# Patient Record
Sex: Male | Born: 1985 | Race: White | Hispanic: No | Marital: Married | Smoking: Never smoker
Health system: Southern US, Community
[De-identification: ages and names within clinical notes are randomized; demographics above are authoritative.]

## PROBLEM LIST (undated history)

## (undated) DIAGNOSIS — I10 Essential (primary) hypertension: Secondary | ICD-10-CM

## (undated) HISTORY — PX: TONSILLECTOMY: SUR1361

---

## 2016-05-31 ENCOUNTER — Ambulatory Visit: Payer: 59 | Attending: Family Medicine | Admitting: Physical Therapy

## 2016-05-31 DIAGNOSIS — M6281 Muscle weakness (generalized): Secondary | ICD-10-CM | POA: Insufficient documentation

## 2016-05-31 DIAGNOSIS — R293 Abnormal posture: Secondary | ICD-10-CM

## 2016-05-31 DIAGNOSIS — M5442 Lumbago with sciatica, left side: Secondary | ICD-10-CM | POA: Insufficient documentation

## 2016-05-31 NOTE — Therapy (Signed)
[facility] [address], [address] [place], [place], [address] Phone: [phone]   Fax:  [phone]  Physical Therapy Evaluation  Patient Details  Name: Willie Kent MRN: [id number] Date of Birth: May 15, 1986 Referring Provider: Farris Has  Encounter Date: 05/31/2016      PT End of Session - 05/31/16 2004    Visit Number 1   Date for PT Re-Evaluation 07/26/16   Authorization Type UHC   PT Start Time 0930   PT Stop Time 1015   PT Time Calculation (min) 45 min   Activity Tolerance Patient tolerated treatment well      No past medical history on file.  No past surgical history on file.  There were no vitals filed for this visit.       Subjective Assessment - 05/31/16 0938    Subjective Works at [employer] and was pulling a pallet jack to pull a pallet of water off (6 months ago).  Took muscle relaxers 30 days and was feeling better.  3 weeks ago was pushing 3 carts into store and it hurts worse than it did originally.  This time left > right LBP with left > right post/thigh pain to knee.   Pertinent History PCN allergy; takes med for HTN   Limitations House hold activities;Lifting;Walking   How long can you sit comfortably? with a back on chair I can sit an indefinite amt out ime   How long can you walk comfortably? 2 hours   Diagnostic tests none yet   Patient Stated Goals I want to strengthen and prevent from happening in the future;  not take meds   Currently in Pain? Yes   Pain Score 1    Pain Location Back   Pain Orientation Left   Pain Type Acute pain   Aggravating Factors  bending; sit without support like on the floor with 9 month old baby;     Pain Relieving Factors lying down especially on back with knees bent            [facility] PT Assessment - 05/31/16 0001      Assessment   Medical Diagnosis low back pain   Referring Provider Farris Has   Onset Date/Surgical Date --  3 weeks ago   Hand Dominance  Right   Next MD Visit not scheduled  if not better in 3 weeks will get MRI   Prior Therapy knee PT in high school     Precautions   Precautions None     Restrictions   Weight Bearing Restrictions No     Balance Screen   Has the patient fallen in the past 6 months No   Has the patient had a decrease in activity level because of a fear of falling?  No   Is the patient reluctant to leave their home because of a fear of falling?  No     Home Environment   Living Environment Private residence   Type of Home House     Prior Function   Level of Independence Independent   Vocation Full time employment   Leisure 4 yr. and 79 yr old and infant;  [employer]     Observation/Other Assessments   Focus on Therapeutic Outcomes (FOTO)  43% limitation      Posture/Postural Control   Posture/Postural Control Postural limitations   Postural Limitations Decreased lumbar lordosis     AROM   AROM Assessment Site Lumbar   Lumbar Flexion 25  Lumbar Extension 20   Lumbar - Right Side Bend 43   Lumbar - Left Side Bend 30     Strength   Strength Assessment Site Lumbar   Lumbar Flexion 4-/5   Lumbar Extension 4-/5     Palpation   Palpation comment No muscular tenderness     Slump test   Findings Positive   Side Left   Comment Also painful on left with right slump     Prone Knee Bend Test   Findings Negative     Straight Leg Raise   Findings Positive   Side  Left                           PT Education - 05/31/16 2004    Education provided Yes   Education Details trial of prone press ups or standing extensions;  flexion avoidance   Person(s) Educated Patient   Methods Explanation;Demonstration;Handout   Comprehension Verbalized understanding;Returned demonstration          PT Short Term Goals - 05/31/16 2016      PT SHORT TERM GOAL #1   Title The patient will demonstrate basic understanding of basic self management techniques including use of  lumbar roll, body mechanics at home/work to promote healing  06/28/16   Time 4   Period Weeks   Status New     PT SHORT TERM GOAL #2   Title Patient will report a 30% improvement in back pain and centralization of symptoms   Time 4   Period Weeks   Status New     PT SHORT TERM GOAL #3   Title Patient will have improved lumbar flexion to 40 degrees, extension to 25 and left sidbending to 38 degrees needed for home and work mobility   Time 4   Period Weeks   Status New           PT Long Term Goals - 05/31/16 2020      PT LONG TERM GOAL #1   Title The patient will be independent in safe self progression of HEP for further improvements in ROM, strength and pain and to decrease chance of recurrence   07/26/16   Time 8   Period Weeks   Status New     PT LONG TERM GOAL #2   Title The patient will report a 60% improvement in pain and centralization of symptoms with home and work ADLs   Time 8   Period Weeks   Status New     PT LONG TERM GOAL #3   Title The patient will have improved core/trunk strength to 4/5 needed for lifting, pushing/pulling at work   Time 8   Period Weeks   Status New     PT LONG TERM GOAL #4   Title FOTO functional outcome score improved from 43% limitation to 28% limitation indicating improved function with less pain   Time 8   Period Weeks   Status New               Plan - 05/31/16 2005    Clinical Impression Statement The patient reports that 6 months ago, while at work, he injured his back while moving a pallet of water.  His symptoms improved with muscle relaxers.  Three weeks ago he was merely pushing 3 shopping carts when he had a sudden return of back pain with radiating pain down the posterior-lateral left thigh to the knee.  Pain is  worsened with lifting and bending and sitting on the floor without back support.  He notes some pain with putting his 46 month old baby into the crib.  Decreased lumbar lordosis.  Decreased lumbar AROM:  flex  25, extension 20, right sidebend 43, left sidebend30 degrees.   Possible extension preference.   Decreased trunk/core strength 4-/5.  Positive left slump test as well as cross symptoms with right slump test.  Positive left SLR.   Normal LE strength and sensation.   The patient would benefit from PT to address these deficits.  The patient is of low complexity evaluation.     Rehab Potential Good   PT Frequency 2x / week   PT Duration 8 weeks   PT Treatment/Interventions ADLs/Self Care Home Management;Electrical Stimulation;Cryotherapy;Moist Heat;Traction;Ultrasound;Therapeutic exercise;Patient/family education;Manual techniques;Dry needling;Taping   PT Next Visit Plan assess response to trial of lumbar extensions to centralize symptoms and progress with overpressure;  PA lumbar mobs; review body mechanics;  abdominal brace;  e-stim/heat as needed for pain      Patient will benefit from skilled therapeutic intervention in order to improve the following deficits and impairments:  Decreased range of motion, Decreased strength, Pain, Postural dysfunction  Visit Diagnosis: Left-sided low back pain with left-sided sciatica - Plan: PT plan of care cert/re-cert  Muscle weakness (generalized) - Plan: PT plan of care cert/re-cert  Abnormal posture - Plan: PT plan of care cert/re-cert     Problem List There are no active problems to display for this patient.  Lavinia Sharps, PT 05/31/16 8:28 PM Phone: [phone] Fax: [phone]  Vivien Presto 05/31/2016, 8:27 PM  [facility] [address], [address] [place], [place], [address] Phone: [phone]   Fax:  [phone]  Name: Willie Kent MRN: [id number] Date of Birth: 1986/07/16

## 2016-05-31 NOTE — Patient Instructions (Signed)
Willie Kent PT [facility] [address], [address] [place], [place] [address] Phone # [phone] Fax [phone]    

## 2016-06-07 ENCOUNTER — Encounter: Payer: 59 | Admitting: Physical Therapy

## 2016-06-09 ENCOUNTER — Encounter: Payer: Self-pay | Admitting: Physical Therapy

## 2016-06-09 ENCOUNTER — Ambulatory Visit: Payer: 59 | Admitting: Physical Therapy

## 2016-06-09 DIAGNOSIS — M6281 Muscle weakness (generalized): Secondary | ICD-10-CM

## 2016-06-09 DIAGNOSIS — M5442 Lumbago with sciatica, left side: Secondary | ICD-10-CM | POA: Diagnosis not present

## 2016-06-09 DIAGNOSIS — R293 Abnormal posture: Secondary | ICD-10-CM

## 2016-06-09 NOTE — Therapy (Signed)
[facility] [address], [address] [place], [place], [address] Phone: [phone]   Fax:  [phone]  Physical Therapy Treatment  Patient Details  Name: Willie Kent MRN: [id number] Date of Birth: 10/27/1985 Referring Provider: Farris HasAaron Morrow  Encounter Date: 06/09/2016      PT End of Session - 06/09/16 1050    Visit Number 2   Date for PT Re-Evaluation 07/26/16   Authorization Type UHC   PT Start Time 1011   PT Stop Time 1058   PT Time Calculation (min) 47 min   Activity Tolerance Patient tolerated treatment well   Behavior [facility] Therapy [facility] for tasks assessed/performed      History reviewed. No pertinent past medical history.  History reviewed. No pertinent surgical history.  There were no vitals filed for this visit.      Subjective Assessment - 06/09/16 1014    Subjective Pt reports back has been doing better over all but over did it yesterday so having increase pain today. Has been doing home exercises and repotrs those are helping.    Pertinent History PCN allergy; takes med for HTN   Limitations House hold activities;Lifting;Walking   How long can you sit comfortably? with a back on chair I can sit an indefinite amt out ime   How long can you walk comfortably? 2 hours   Diagnostic tests none yet   Patient Stated Goals I want to strengthen and prevent from happening in the future;  not take meds   Currently in Pain? Yes   Pain Score 2    Pain Location Back   Pain Orientation Left   Pain Descriptors / Indicators Sharp;Aching   Pain Type Acute pain   Pain Onset More than a month ago   Pain Frequency Intermittent   Aggravating Factors  Bending prolonged standing walking   Pain Relieving Factors Rest and Naproxin   Multiple Pain Sites No                         [facility] Adult PT Treatment/Exercise - 06/09/16 0001      Exercises   Exercises Lumbar;Knee/Hip     Lumbar Exercises: Stretches   Prone on Elbows Stretch 2 reps;10 seconds  Modified for standing and sitting     Lumbar Exercises: Aerobic   Stationary Bike 8 mins L4     Lumbar Exercises: Standing   Other Standing Lumbar Exercises 3 direction leg kicks     Knee/Hip Exercises: Stretches   Theme park managerGastroc Stretch Both;2 reps     Knee/Hip Exercises: Standing   Other Standing Knee Exercises --   Other Standing Knee Exercises Standing hamstring curls #3     Knee/Hip Exercises: Seated   Ball Squeeze 20   Clamshell with TheraBand Red   Sit to Sand 3 sets;10 reps;without UE support     Modalities   Modalities Electrical Stimulation  to low back with ice                  PT Short Term Goals - 06/09/16 1046      PT SHORT TERM GOAL #1   Title The patient will demonstrate basic understanding of basic self management techniques including use of lumbar roll, body mechanics at home/work to promote healing  06/28/16   Time 4   Period Weeks   Status On-going     PT SHORT TERM GOAL #2   Title Patient will report a 30% improvement in back  pain and centralization of symptoms   Time 4   Period Weeks   Status On-going     PT SHORT TERM GOAL #3   Title Patient will have improved lumbar flexion to 40 degrees, extension to 25 and left sidbending to 38 degrees needed for home and work mobility   Time 4   Period Weeks   Status On-going           PT Long Term Goals - 06/09/16 1049      PT LONG TERM GOAL #1   Title The patient will be independent in safe self progression of HEP for further improvements in ROM, strength and pain and to decrease chance of recurrence   07/26/16   Time 8   Period Weeks   Status On-going     PT LONG TERM GOAL #2   Title The patient will report a 60% improvement in pain and centralization of symptoms with home and work ADLs   Time 8   Period Weeks   Status On-going     PT LONG TERM GOAL #3   Title The patient will have improved core/trunk strength to 4/5 needed for lifting,  pushing/pulling at work   Time 8   Period Weeks               Plan - 06/09/16 1051    Clinical Impression Statement Pt reports back feeling better with therapy and stretching at home. Pt educated on importance of proper body mechanics and how to perform squat porperly. Pt able to demonstrate good body mechanics. Will continue to improve strength for posture and body mechnics need for work and home life.    Rehab Potential Good   PT Frequency 2x / week   PT Duration 8 weeks   PT Treatment/Interventions ADLs/Self Care Home Management;Electrical Stimulation;Cryotherapy;Moist Heat;Traction;Ultrasound;Therapeutic exercise;Patient/family education;Manual techniques;Dry needling;Taping   PT Next Visit Plan Continue to practice proper body mechanics and posture.    Consulted and Agree with Plan of Care Patient      Patient will benefit from skilled therapeutic intervention in order to improve the following deficits and impairments:  Decreased range of motion, Decreased strength, Pain, Postural dysfunction  Visit Diagnosis: Left-sided low back pain with left-sided sciatica  Muscle weakness (generalized)  Abnormal posture     Problem List There are no active problems to display for this patient.   Dessa PhiKatherine Lennix Rotundo PTA 06/09/2016, 11:29 AM  [facility] [address], [address] [place], [place], [address] Phone: [phone]   Fax:  [phone]  Name: Willie Kent MRN: [id number] Date of Birth: 02/19/1986

## 2016-06-14 ENCOUNTER — Encounter: Payer: Self-pay | Admitting: Physical Therapy

## 2016-06-14 ENCOUNTER — Ambulatory Visit: Payer: 59 | Admitting: Physical Therapy

## 2016-06-14 DIAGNOSIS — R293 Abnormal posture: Secondary | ICD-10-CM

## 2016-06-14 DIAGNOSIS — M5442 Lumbago with sciatica, left side: Secondary | ICD-10-CM | POA: Diagnosis not present

## 2016-06-14 DIAGNOSIS — M6281 Muscle weakness (generalized): Secondary | ICD-10-CM

## 2016-06-14 NOTE — Therapy (Addendum)
[facility] [address], [place], [place], [address] Phone: [phone]   Fax:  [phone]  Physical Therapy Treatment/Discharge Summary  Patient Details  Name: Willie Kent MRN: [id number] Date of Birth: 01-Jul-1986 Referring Provider: London Pepper  Encounter Date: 06/14/2016      PT End of Session - 06/14/16 0854    Visit Number 3   Date for PT Re-Evaluation 07/26/16   Authorization Type UHC   PT Start Time 0846   PT Stop Time 0924   PT Time Calculation (min) 38 min   Activity Tolerance Patient tolerated treatment well   Behavior During Therapy [facility] for tasks assessed/performed      History reviewed. No pertinent past medical history.  History reviewed. No pertinent surgical history.  There were no vitals filed for this visit.      Subjective Assessment - 06/14/16 0852    Subjective Pt reports back feeling about the same, not better now worse.    Pertinent History PCN allergy; takes med for HTN   Limitations House hold activities;Lifting;Walking   How long can you sit comfortably? with a back on chair I can sit an indefinite amt out ime   How long can you walk comfortably? 2 hours   Diagnostic tests none yet   Patient Stated Goals I want to strengthen and prevent from happening in the future;  not take meds   Currently in Pain? Yes   Pain Score 2    Pain Location Back   Pain Orientation Left   Pain Descriptors / Indicators Aching;Sharp   Pain Type Acute pain   Pain Onset More than a month ago   Pain Frequency Intermittent   Aggravating Factors  Pushing carts at work   Pain Relieving Factors Rest and Naproxin   Multiple Pain Sites No                         [facility] Adult PT Treatment/Exercise - 06/14/16 0001      Lumbar Exercises: Aerobic   Stationary Bike 6 mins L4  While therapist assesed pain     Lumbar Exercises: Standing   Functional Squats 15 reps     Lumbar Exercises:  Supine   Clam 20 reps;2 seconds   Bridge 20 reps;2 seconds   Straight Leg Raise 1 second;20 reps   Other Supine Lumbar Exercises Supine marches 20x2     Knee/Hip Exercises: Stretches   Press photographer Both;2 reps     Knee/Hip Exercises: Standing   Knee Flexion AROM;Strengthening;2 sets;10 reps  #3   Hip Flexion AAROM;Stengthening;1 set;20 reps;Knee bent   Hip Abduction Stengthening;AAROM;1 set;20 reps;Knee straight   Hip Extension AROM;Stengthening;Both;20 reps;1 set   SLS On blue mat x1 min each     Knee/Hip Exercises: Seated   Clamshell with TheraBand Red                  PT Short Term Goals - 06/14/16 0905      PT SHORT TERM GOAL #1   Title The patient will demonstrate basic understanding of basic self management techniques including use of lumbar roll, body mechanics at home/work to promote healing  06/28/16   Time 4   Period Weeks   Status Achieved     PT SHORT TERM GOAL #2   Title Patient will report a 30% improvement in back pain and centralization of symptoms   Time 4   Period Weeks   Status On-going  PT SHORT TERM GOAL #3   Title Patient will have improved lumbar flexion to 40 degrees, extension to 25 and left sidbending to 38 degrees needed for home and work mobility   Time 4   Period Weeks   Status On-going           PT Long Term Goals - 06/14/16 0906      PT LONG TERM GOAL #1   Title The patient will be independent in safe self progression of HEP for further improvements in ROM, strength and pain and to decrease chance of recurrence   07/26/16   Time 8   Period Weeks   Status On-going     PT LONG TERM GOAL #2   Title The patient will report a 60% improvement in pain and centralization of symptoms with home and work ADLs   Time 8   Period Weeks   Status On-going     PT LONG TERM GOAL #3   Title The patient will have improved core/trunk strength to 4/5 needed for lifting, pushing/pulling at work   Time 8   Period Weeks   Status  On-going     PT LONG TERM GOAL #4   Title FOTO functional outcome score improved from 43% limitation to 28% limitation indicating improved function with less pain   Time 8   Period Weeks   Status On-going               Plan - 06/14/16 0906    Clinical Impression Statement Pt reports back feeling about the same. Continues to have increased pain during work activities. Pt has met goal for basic self managment with proper body mechanics and rolling techniques. Pt has increased sharp pain on Lt low back SI area during straight leg hip flexion even while embracing abdonminals. Able to tolerate hip flexion if knee is bent.     Rehab Potential Good   PT Frequency 2x / week   PT Duration 8 weeks   PT Treatment/Interventions ADLs/Self Care Home Management;Electrical Stimulation;Cryotherapy;Moist Heat;Traction;Ultrasound;Therapeutic exercise;Patient/family education;Manual techniques;Dry needling;Taping   PT Next Visit Plan Continue to increase hip and core stability. Continue to decrease Lt hip tightness and pain. Flossing.    Consulted and Agree with Plan of Care Patient     PHYSICAL THERAPY DISCHARGE SUMMARY  Visits from Start of Care: 3  Current functional level related to goals / functional outcomes: The patient has no-showed for last several appointments.  Called and left message and patient has not called back to reschedule.  Discharge from PT at this time.  No goals met.     Remaining deficits: As above   Education / Equipment: Initial HEP Plan: Patient agrees to discharge.  Patient goals were not met. Patient is being discharged due to not returning since the last visit.  ?????         Patient will benefit from skilled therapeutic intervention in order to improve the following deficits and impairments:  Decreased range of motion, Decreased strength, Pain, Postural dysfunction  Visit Diagnosis: Left-sided low back pain with left-sided sciatica  Muscle weakness  (generalized)  Abnormal posture     Problem List There are no active problems to display for this patient.  Ruben Im, PT 07/07/16 2:04 PM Phone: [phone] Fax: [phone]  Mikle Bosworth PTA 06/14/2016, 9:27 AM  [facility] [address] [place], [place], [address] Phone: [phone]   Fax:  [phone]  Name: Willie Kent MRN: [id number] Date  of Birth: [birth year]

## 2016-06-16 ENCOUNTER — Encounter: Payer: 59 | Admitting: Physical Therapy

## 2016-06-21 ENCOUNTER — Ambulatory Visit: Payer: 59 | Admitting: Physical Therapy

## 2016-06-23 ENCOUNTER — Ambulatory Visit: Payer: 59

## 2016-06-28 ENCOUNTER — Ambulatory Visit: Payer: 59 | Attending: Family Medicine | Admitting: Physical Therapy

## 2016-06-30 ENCOUNTER — Ambulatory Visit: Payer: 59 | Admitting: Physical Therapy

## 2017-10-27 DIAGNOSIS — B349 Viral infection, unspecified: Secondary | ICD-10-CM | POA: Diagnosis not present

## 2017-10-29 ENCOUNTER — Emergency Department (HOSPITAL_BASED_OUTPATIENT_CLINIC_OR_DEPARTMENT_OTHER): Payer: BLUE CROSS/BLUE SHIELD

## 2017-10-29 ENCOUNTER — Encounter (HOSPITAL_BASED_OUTPATIENT_CLINIC_OR_DEPARTMENT_OTHER): Payer: Self-pay | Admitting: Emergency Medicine

## 2017-10-29 ENCOUNTER — Emergency Department (HOSPITAL_BASED_OUTPATIENT_CLINIC_OR_DEPARTMENT_OTHER)
Admission: EM | Admit: 2017-10-29 | Discharge: 2017-10-29 | Disposition: A | Discharge status: Home / Self Care | Payer: BLUE CROSS/BLUE SHIELD | Attending: Emergency Medicine | Admitting: Emergency Medicine

## 2017-10-29 ENCOUNTER — Other Ambulatory Visit: Payer: Self-pay

## 2017-10-29 DIAGNOSIS — M7918 Myalgia, other site: Secondary | ICD-10-CM | POA: Diagnosis not present

## 2017-10-29 DIAGNOSIS — J181 Lobar pneumonia, unspecified organism: Secondary | ICD-10-CM | POA: Diagnosis not present

## 2017-10-29 DIAGNOSIS — E876 Hypokalemia: Secondary | ICD-10-CM | POA: Insufficient documentation

## 2017-10-29 DIAGNOSIS — I10 Essential (primary) hypertension: Secondary | ICD-10-CM | POA: Diagnosis not present

## 2017-10-29 DIAGNOSIS — Z79899 Other long term (current) drug therapy: Secondary | ICD-10-CM | POA: Insufficient documentation

## 2017-10-29 DIAGNOSIS — R51 Headache: Secondary | ICD-10-CM | POA: Insufficient documentation

## 2017-10-29 DIAGNOSIS — R000 Tachycardia, unspecified: Secondary | ICD-10-CM | POA: Diagnosis not present

## 2017-10-29 DIAGNOSIS — R05 Cough: Secondary | ICD-10-CM | POA: Diagnosis not present

## 2017-10-29 DIAGNOSIS — M545 Low back pain: Secondary | ICD-10-CM | POA: Diagnosis not present

## 2017-10-29 DIAGNOSIS — J189 Pneumonia, unspecified organism: Secondary | ICD-10-CM | POA: Diagnosis not present

## 2017-10-29 DIAGNOSIS — R509 Fever, unspecified: Secondary | ICD-10-CM | POA: Diagnosis not present

## 2017-10-29 HISTORY — DX: Essential (primary) hypertension: I10

## 2017-10-29 LAB — CBC WITH DIFFERENTIAL/PLATELET
BASOS ABS: 0 10*3/uL (ref 0.0–0.1)
Basophils Relative: 0 %
EOS ABS: 0 10*3/uL (ref 0.0–0.7)
Eosinophils Relative: 0 %
HCT: 40.2 % (ref 39.0–52.0)
Hemoglobin: 15 g/dL (ref 13.0–17.0)
LYMPHS ABS: 1.2 10*3/uL (ref 0.7–4.0)
Lymphocytes Relative: 11 %
MCH: 30 pg (ref 26.0–34.0)
MCHC: 37.3 g/dL — ABNORMAL HIGH (ref 30.0–36.0)
MCV: 80.4 fL (ref 78.0–100.0)
MONO ABS: 1 10*3/uL (ref 0.1–1.0)
Monocytes Relative: 9 %
NEUTROS ABS: 9.1 10*3/uL — AB (ref 1.7–7.7)
Neutrophils Relative %: 80 %
PLATELETS: 164 10*3/uL (ref 150–400)
RBC: 5 MIL/uL (ref 4.22–5.81)
RDW: 13.2 % (ref 11.5–15.5)
WBC: 11.3 10*3/uL — ABNORMAL HIGH (ref 4.0–10.5)

## 2017-10-29 LAB — URINALYSIS, ROUTINE W REFLEX MICROSCOPIC
Bilirubin Urine: NEGATIVE
GLUCOSE, UA: NEGATIVE mg/dL
KETONES UR: NEGATIVE mg/dL
LEUKOCYTES UA: NEGATIVE
NITRITE: NEGATIVE
PROTEIN: NEGATIVE mg/dL
Specific Gravity, Urine: <1.005 — ABNORMAL LOW (ref 1.005–1.030)
pH: 7 (ref 5.0–8.0)

## 2017-10-29 LAB — URINALYSIS, MICROSCOPIC (REFLEX)

## 2017-10-29 LAB — COMPREHENSIVE METABOLIC PANEL
ALBUMIN: 4.3 g/dL (ref 3.5–5.0)
ALK PHOS: 69 U/L (ref 38–126)
ALT: 25 U/L (ref 17–63)
AST: 32 U/L (ref 15–41)
Anion gap: 12 (ref 5–15)
BUN: 14 mg/dL (ref 6–20)
CALCIUM: 8.5 mg/dL — AB (ref 8.9–10.3)
CO2: 23 mmol/L (ref 22–32)
CREATININE: 1.23 mg/dL (ref 0.61–1.24)
Chloride: 96 mmol/L — ABNORMAL LOW (ref 101–111)
GFR calc Af Amer: >60 mL/min (ref >60)
GLUCOSE: 137 mg/dL — AB (ref 65–99)
Potassium: 2.7 mmol/L — CL (ref 3.5–5.1)
Sodium: 131 mmol/L — ABNORMAL LOW (ref 135–145)
Total Bilirubin: 1.9 mg/dL — ABNORMAL HIGH (ref 0.3–1.2)
Total Protein: 7.1 g/dL (ref 6.5–8.1)

## 2017-10-29 LAB — I-STAT CG4 LACTIC ACID, ED
Lactic Acid, Venous: 2.39 mmol/L (ref 0.5–1.9)
Lactic Acid, Venous: 0.94 mmol/L (ref 0.5–1.9)

## 2017-10-29 MED ORDER — ACETAMINOPHEN 500 MG PO TABS
1000.0000 mg | ORAL_TABLET | Freq: Once | ORAL | Status: AC
  Administered 2017-10-29: 1000 mg via ORAL
  Filled 2017-10-29: qty 2

## 2017-10-29 MED ORDER — LACTATED RINGERS IV BOLUS (SEPSIS)
1000.0000 mL | Freq: Once | INTRAVENOUS | Status: AC
  Administered 2017-10-29: 1000 mL via INTRAVENOUS

## 2017-10-29 MED ORDER — POTASSIUM CHLORIDE CRYS ER 20 MEQ PO TBCR
40.0000 meq | EXTENDED_RELEASE_TABLET | Freq: Once | ORAL | Status: AC
  Administered 2017-10-29: 40 meq via ORAL
  Filled 2017-10-29: qty 2

## 2017-10-29 MED ORDER — SODIUM CHLORIDE 0.9 % IV BOLUS (SEPSIS)
1000.0000 mL | Freq: Once | INTRAVENOUS | Status: AC
  Administered 2017-10-29: 1000 mL via INTRAVENOUS

## 2017-10-29 MED ORDER — AZITHROMYCIN 250 MG PO TABS
500.0000 mg | ORAL_TABLET | Freq: Once | ORAL | Status: AC
  Administered 2017-10-29: 500 mg via ORAL
  Filled 2017-10-29: qty 2

## 2017-10-29 MED ORDER — AZITHROMYCIN 250 MG PO TABS: 250.0000 mg | ORAL_TABLET | Freq: Every day | ORAL | 0 refills | Status: AC

## 2017-10-29 NOTE — ED Provider Notes (Signed)
[facility] EMERGENCY DEPARTMENT Provider Note   CSN: [id number] Arrival date & time: 10/29/17  1623     History   Chief Complaint Chief Complaint  Patient presents with  . Fever    HPI Willie Kent is a 32 y.o. male.  The history is provided by the patient. No language interpreter was used.  Fever     Willie Kent is a 32 y.o. male who presents to the Emergency Department complaining of fever.  He reports a fever that started on Thursday.  He has had generalized malaise and symptoms started abruptly.  He has associated cough with vague sore throat.  He has mild headache, low back pain.  He did vomit yesterday, none since then.  He was seen at urgent care on Friday and had a rapid strep and rapid flu that were both negative.  He was started empirically on Tamiflu as well as cough syrup and Zofran for nausea.  He reports persistent fevers at home ranging from 101-104 today.  Symptoms are moderate, constant, worsening.  No chest pain, abdominal pain, dysuria, rash.  No foreign travel.  He has had sick contacts and was recently visiting a sick family member in the hospital. Past Medical History:  Diagnosis Date  . Hypertension     There are no active problems to display for this patient.   Past Surgical History:  Procedure Laterality Date  . TONSILLECTOMY         Home Medications    Prior to Admission medications   Medication Sig Start Date End Date Taking? Authorizing Provider  hydrochlorothiazide (HYDRODIURIL) 25 MG tablet Take 25 mg by mouth daily.   Yes [provider]  azithromycin (ZITHROMAX) 250 MG tablet Take 1 tablet (250 mg total) by mouth daily. 10/29/17   Tilden Fossa, MD    Family History No family history on file.  Social History Social History   Tobacco Use  . Smoking status: Never Smoker  . Smokeless tobacco: Never Used  Substance Use Topics  . Alcohol use: Not on file  . Drug use: Not on file     Allergies    Penicillins   Review of Systems Review of Systems  Constitutional: Positive for fever.  All other systems reviewed and are negative.    Physical Exam Updated Vital Signs BP 124/71   Pulse 88   Temp (!) 100.8 F (38.2 C) (Oral)   Resp 14   Ht 6\' 2"  (1.88 m)   Wt 132.5 kg (292 lb)   SpO2 98%   BMI 37.49 kg/m   Physical Exam  Constitutional: He is oriented to person, place, and time. He appears well-developed and well-nourished.  HENT:  Head: Normocephalic and atraumatic.  Right Ear: External ear normal.  Left Ear: External ear normal.  Mild erythema in the posterior oropharynx without any exudates.  Cardiovascular: Regular rhythm.  No murmur heard. Tachycardic  Pulmonary/Chest: Effort normal and breath sounds normal. No respiratory distress.  Abdominal: Soft. There is no tenderness. There is no rebound and no guarding.  Musculoskeletal: He exhibits no edema or tenderness.  Neurological: He is alert and oriented to person, place, and time.  Skin: Skin is warm and dry.  Psychiatric: He has a normal mood and affect. His behavior is normal.  Nursing note and vitals reviewed.    ED Treatments / Results  Labs (all labs ordered are listed, but only abnormal results are displayed) Labs Reviewed  COMPREHENSIVE METABOLIC PANEL - Abnormal; Notable for the following  components:      Result Value   Sodium 131 (*)    Potassium 2.7 (*)    Chloride 96 (*)    Glucose, Bld 137 (*)    Calcium 8.5 (*)    Total Bilirubin 1.9 (*)    All other components within normal limits  CBC WITH DIFFERENTIAL/PLATELET - Abnormal; Notable for the following components:   WBC 11.3 (*)    MCHC 37.3 (*)    Neutro Abs 9.1 (*)    All other components within normal limits  URINALYSIS, ROUTINE W REFLEX MICROSCOPIC - Abnormal; Notable for the following components:   Specific Gravity, Urine <1.005 (*)    Hgb urine dipstick TRACE (*)    All other components within normal limits  URINALYSIS,  MICROSCOPIC (REFLEX) - Abnormal; Notable for the following components:   Bacteria, UA MANY (*)    Squamous Epithelial / LPF 0-5 (*)    All other components within normal limits  I-STAT CG4 LACTIC ACID, ED - Abnormal; Notable for the following components:   Lactic Acid, Venous 2.39 (*)    All other components within normal limits  INFLUENZA PANEL BY PCR (TYPE A & B)  I-STAT CG4 LACTIC ACID, ED    EKG  EKG Interpretation  Date/Time:  Sunday October 29 2017 18:01:32 EST Ventricular Rate:  94 PR Interval:    QRS Duration: 95 QT Interval:  352 QTC Calculation: 441 R Axis:   97 Text Interpretation:  Sinus rhythm Borderline right axis deviation Confirmed by Tilden Fossaees, Tevon Berhane [phone]([id number]) on 10/29/2017 6:07:20 PM       Radiology Dg Chest 2 View  Result Date: 10/29/2017 CLINICAL DATA:  Cough. EXAM: CHEST  2 VIEW COMPARISON:  None. FINDINGS: Right upper lobe infiltrate. The heart, hila, mediastinum, lungs, and pleura are otherwise unremarkable. IMPRESSION: Right upper lobe pneumonia.  Recommend follow-up to resolution. Electronically Signed   By: Gerome Samavid  Williams III M.D   On: 10/29/2017 18:12    Procedures Procedures (including critical care time)  Medications Ordered in ED Medications  acetaminophen (TYLENOL) tablet 1,000 mg (1,000 mg Oral Given 10/29/17 1652)  sodium chloride 0.9 % bolus 1,000 mL (0 mLs Intravenous Stopped 10/29/17 1809)  lactated ringers bolus 1,000 mL (0 mLs Intravenous Stopped 10/29/17 1920)  potassium chloride SA (K-DUR,KLOR-CON) CR tablet 40 mEq (40 mEq Oral Given 10/29/17 1759)  azithromycin (ZITHROMAX) tablet 500 mg (500 mg Oral Given 10/29/17 1919)     Initial Impression / Assessment and Plan / ED Course  I have reviewed the triage vital signs and the nursing notes.  Pertinent labs & imaging results that were available during my care of the patient were reviewed by me and considered in my medical decision making (see chart for details).     Patient here for evaluation  of fever, cough, body aches.  He is nontoxic appearing on examination with no respiratory distress.  Chest x-ray is concerning for right upper lobe pneumonia.  Initial lactate is mildly elevated but improved after IV fluid hydration.  On repeat assessment he feels significantly improved.  Discussed with patient findings of community acquired pneumonia, will treat with antibiotics.  Presentation is not consistent with sepsis, respiratory failure, MRSA pneumonia.  Discussed close outpatient follow-up as well as return precautions.  Final Clinical Impressions(s) / ED Diagnoses   Final diagnoses:  Community acquired pneumonia of right upper lobe of lung (HCC)  Hypokalemia    ED Discharge Orders        Ordered    azithromycin (  ZITHROMAX) 250 MG tablet  Daily     10/29/17 1956       Tilden Fossa, MD 10/30/17 1455

## 2017-10-29 NOTE — ED Triage Notes (Addendum)
Fever, cough, body aches since Friday. Taking tamiflu and zofran. Seen by PCP Friday, neg strep, neg flu swab. Pt took ibuprofen at 330.

## 2017-10-29 NOTE — ED Notes (Signed)
Alert, NAD, calm, interactive, resps e/u, speaking in clear complete sentences, no dyspnea noted, skin W&D, VSS, (denies: pain, sob, nausea, dizziness or visual changes). 

## 2017-10-29 NOTE — ED Notes (Addendum)
K+ 2.7 , results given to Dr Madilyn Hookees

## 2017-10-29 NOTE — ED Notes (Signed)
ED Provider at bedside. 

## 2017-10-30 LAB — INFLUENZA PANEL BY PCR (TYPE A & B)
Influenza A By PCR: NEGATIVE
Influenza B By PCR: NEGATIVE

## 2017-10-30 NOTE — ED Notes (Signed)
Pt. Called for results of his influenza results.  Results reviewed  With the pt.

## 2017-11-02 DIAGNOSIS — E876 Hypokalemia: Secondary | ICD-10-CM | POA: Diagnosis not present

## 2017-11-02 DIAGNOSIS — J189 Pneumonia, unspecified organism: Secondary | ICD-10-CM | POA: Diagnosis not present

## 2017-11-02 DIAGNOSIS — R7309 Other abnormal glucose: Secondary | ICD-10-CM | POA: Diagnosis not present

## 2017-11-02 DIAGNOSIS — E871 Hypo-osmolality and hyponatremia: Secondary | ICD-10-CM | POA: Diagnosis not present

## 2018-01-18 DIAGNOSIS — Z0000 Encounter for general adult medical examination without abnormal findings: Secondary | ICD-10-CM | POA: Diagnosis not present

## 2019-05-13 DIAGNOSIS — Z13220 Encounter for screening for lipoid disorders: Secondary | ICD-10-CM | POA: Diagnosis not present

## 2019-05-13 DIAGNOSIS — I10 Essential (primary) hypertension: Secondary | ICD-10-CM | POA: Diagnosis not present

## 2019-09-12 DIAGNOSIS — I10 Essential (primary) hypertension: Secondary | ICD-10-CM | POA: Diagnosis not present

## 2019-09-12 DIAGNOSIS — Z13220 Encounter for screening for lipoid disorders: Secondary | ICD-10-CM | POA: Diagnosis not present

## 2019-10-31 DIAGNOSIS — E876 Hypokalemia: Secondary | ICD-10-CM | POA: Diagnosis not present

## 2019-11-27 DIAGNOSIS — Z20828 Contact with and (suspected) exposure to other viral communicable diseases: Secondary | ICD-10-CM | POA: Diagnosis not present

## 2019-11-27 DIAGNOSIS — Z03818 Encounter for observation for suspected exposure to other biological agents ruled out: Secondary | ICD-10-CM | POA: Diagnosis not present

## 2020-01-30 DIAGNOSIS — Z03818 Encounter for observation for suspected exposure to other biological agents ruled out: Secondary | ICD-10-CM | POA: Diagnosis not present

## 2020-01-30 DIAGNOSIS — Z20828 Contact with and (suspected) exposure to other viral communicable diseases: Secondary | ICD-10-CM | POA: Diagnosis not present

## 2020-01-31 DIAGNOSIS — J988 Other specified respiratory disorders: Secondary | ICD-10-CM | POA: Diagnosis not present

## 2020-10-02 DIAGNOSIS — Z23 Encounter for immunization: Secondary | ICD-10-CM | POA: Diagnosis not present

## 2020-10-02 DIAGNOSIS — M62830 Muscle spasm of back: Secondary | ICD-10-CM | POA: Diagnosis not present

## 2020-10-18 DIAGNOSIS — U071 COVID-19: Secondary | ICD-10-CM | POA: Diagnosis not present

## 2020-10-18 DIAGNOSIS — Z20822 Contact with and (suspected) exposure to covid-19: Secondary | ICD-10-CM | POA: Diagnosis not present

## 2020-11-05 ENCOUNTER — Ambulatory Visit: Payer: BLUE CROSS/BLUE SHIELD | Admitting: Physical Therapy

## 2020-11-25 ENCOUNTER — Ambulatory Visit: Payer: BC Managed Care – PPO | Attending: Family Medicine | Admitting: Physical Therapy

## 2020-11-25 ENCOUNTER — Encounter: Payer: Self-pay | Admitting: Physical Therapy

## 2020-11-25 ENCOUNTER — Other Ambulatory Visit: Payer: Self-pay

## 2020-11-25 DIAGNOSIS — G8929 Other chronic pain: Secondary | ICD-10-CM | POA: Insufficient documentation

## 2020-11-25 DIAGNOSIS — R252 Cramp and spasm: Secondary | ICD-10-CM | POA: Insufficient documentation

## 2020-11-25 DIAGNOSIS — M545 Low back pain, unspecified: Secondary | ICD-10-CM | POA: Diagnosis not present

## 2020-11-25 NOTE — Therapy (Signed)
[facility] [address], [address] [place], [place], [address] Phone: [phone]   Fax:  [phone]  Physical Therapy Evaluation  Patient Details  Name: Willie Kent MRN: [id number] Date of Birth: [birth year] Referring Provider (PT): Garth Bigness MD Farris Has is PCP)   Encounter Date: 11/25/2020   PT End of Session - 11/25/20 0801    Visit Number 1    Date for PT Re-Evaluation 01/20/21    PT Start Time 0801    PT Stop Time 0841    PT Time Calculation (min) 40 min    Activity Tolerance Patient tolerated treatment well    Behavior During Therapy [facility] for tasks assessed/performed           Past Medical History:  Diagnosis Date  . Hypertension     Past Surgical History:  Procedure Laterality Date  . TONSILLECTOMY      There were no vitals filed for this visit.    Subjective Assessment - 11/25/20 0804    Subjective Pain is mostly in mid/lower back. He has a h/o back pain starting about 10 yrs ago. Flare up 3-4 years ago and had PT with good results. Then back started hurting in December 2021. Twisting motions cause spasms in the low back. No pain today. Looking to get stronger so he can get back to working out and not hurt functionally. When his back is hurting, getting in and out of his car is the worst. Better with walking around but can vary from 10 min to 3 hours to loosen up.    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    Currently in Pain? No/denies              [facility] PT Assessment - 11/25/20 0001      Assessment   Medical Diagnosis pain thoracic spine    Referring Provider (PT) Garth Bigness MD Farris Has is PCP)    Onset Date/Surgical Date 09/23/20    Hand Dominance Right    Prior Therapy yes      Precautions   Precautions None      Restrictions   Weight  Bearing Restrictions No      Balance Screen   Has the patient fallen in the past 6 months No    Has the patient had a decrease in activity level because of a fear of falling?  No    Is the patient reluctant to leave their home because of a fear of falling?  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    Leisure working out; playing with kids (sledding etc)      Posture/Postural Control   Posture Comments even pelvic and scapular landmarks      ROM / Strength   AROM / PROM / Strength AROM;Strength      AROM   AROM Assessment Site Lumbar    Lumbar Flexion 45    Lumbar Extension 40    Lumbar - Right Side Bend Full    Lumbar - Left Side Bend Full    Lumbar -  Right Rotation hypermobile    Lumbar - Left Rotation hypermobile      Strength   Overall Strength Comments Bil LE 5/5; Core strength: plank able to hold 30 sec but challenging; modified side plank x 30 sec; full side plank too difficult      Flexibility   Soft Tissue Assessment /Muscle Length yes    Hamstrings bil Rt> Lt    Quadriceps Lt tight    ITB bil gastroc/soleus tightness    Piriformis mild bil tightness      Palpation   Palpation comment bil gluteals tender      Special Tests    Special Tests Lumbar    Lumbar Tests FABER test;Slump Test;Straight Leg Raise;Prone Knee Bend Test      FABER test   Comment mild pain in groin right side      Slump test   Findings Positive    Side Left    Comment neg right      Prone Knee Bend Test   Comment neg bil      Straight Leg Raise   Comment neg bil                      Objective measurements completed on examination: See above findings.               PT Education - 11/25/20 1242    Education Details HEP     Person(s) Educated Patient    Methods Explanation;Demonstration;Handout    Comprehension Verbalized understanding;Returned demonstration            PT Short Term Goals - 11/25/20 1251      PT SHORT TERM GOAL #1   Title Ind with initial HEP    Time 4    Period Weeks    Status New    Target Date 12/23/20      PT SHORT TERM GOAL #2   Title Patient will report a 30% improvement in back pain and spasm    Time 4    Period Weeks    Status New      PT SHORT TERM GOAL #3   Title --             PT Long Term Goals - 11/25/20 1252      PT LONG TERM GOAL #1   Title The patient will be independent in safe self progression of HEP for further improvements in ROM, strength and pain and to decrease chance of recurrence    Time 8    Period Weeks    Status New    Target Date 01/20/21      PT LONG TERM GOAL #2   Title The patient will report a 90% improvement in pain and spasm with home and work ADLs    Time 8    Period Weeks    Status New      PT LONG TERM GOAL #3   Title patient able to demo improved core strength with progression of planks/side planks and functional stabilization TE.    Time 8    Period Weeks    Status New      PT LONG TERM GOAL #4   Title -                  Plan - 11/25/20 0841    Clinical Impression Statement Patient presents with 10 yr h/o of LBP. He had PT 3-4 years ago with good success but pain returned  in Dec 2021. Pain is intermittent but occurs whenever he tries to do heavier work with ADLS or playing with his kids. He has no pain today, but experiences spasms intermittently. When his back is flared up he has pain with getting in/out of car. He is limited in his lumbar ROM and hips due to tight muscles, especially bil HS Rt> Lt and left quadriceps. He has moderate hip rotator tightness as well. He is somewhat hypermobile in his lumbar spine espcially with rotation and will benefit from stabilization exercises. Plank and modified side plank  were issued in HEP.  Patient would like to stengthen his back to prevent further injury with ADLS.    Personal Factors and Comorbidities Comorbidity 1    Comorbidities obesity    Stability/Clinical Decision Making Stable/Uncomplicated    Clinical Decision Making Low    Rehab Potential Excellent    PT Frequency 2x / week   probably 1x/wk per pt   PT Duration 8 weeks    PT Treatment/Interventions ADLs/Self Care Home Management;Cryotherapy;Electrical Stimulation;Iontophoresis 4mg /ml Dexamethasone;Moist Heat;Neuromuscular re-education;Therapeutic exercise;Therapeutic activities;Patient/family education;Manual techniques;Dry needling;Taping    PT Next Visit Plan lumbar and LE flexibilty; body mechanics; lumbar stabilization; progress to functional strengthening    PT Home Exercise Plan [email or web address]    Consulted and Agree with Plan of Care Patient           Patient will benefit from skilled therapeutic intervention in order to improve the following deficits and impairments:  Decreased range of motion,Obesity,Increased muscle spasms,Pain,Decreased strength,Impaired flexibility,Hypermobility  Visit Diagnosis: Chronic low back pain, unspecified back pain laterality, unspecified whether sciatica present  Cramp and spasm     Problem List There are no problems to display for this patient.  Rickeya Manus PT 11/25/2020, 12:55 PM  [facility] [address], [address] [place], [place], [place] Phone: [phone]   Fax:  [phone]  Name: Willie Kent MRN: Cletus Gash Date of Birth: Dec 23, 1985

## 2020-11-25 NOTE — Patient Instructions (Addendum)
Access Code: [email or web address] URL: [email or web address] Date: 11/25/2020 Prepared by: Raynelle Fanning  Exercises Seated Hamstring Stretch - 2 x daily - 7 x weekly - 1 sets - 3 reps - 30-60 sec hold Gastroc Stretch on Wall - 2 x daily - 7 x weekly - 3 reps - 1 sets - 30-60 sec hold Soleus Stretch on Wall - 2 x daily - 7 x weekly - 1 sets - 3 reps - 30-60 sec hold Down Dog from Plank - 1 x daily - 7 x weekly - 1 sets - 10 reps Standard Plank - 1 x daily - 7 x weekly - 1 sets - 5 reps - max hold Side Plank on Knees - 2 x daily - 7 x weekly - 1 sets - 5 reps - max hold hold

## 2020-12-02 ENCOUNTER — Ambulatory Visit (HOSPITAL_BASED_OUTPATIENT_CLINIC_OR_DEPARTMENT_OTHER): Payer: BC Managed Care – PPO | Attending: Family Medicine | Admitting: Physical Therapy

## 2020-12-09 ENCOUNTER — Other Ambulatory Visit: Payer: Self-pay

## 2020-12-09 ENCOUNTER — Ambulatory Visit (HOSPITAL_BASED_OUTPATIENT_CLINIC_OR_DEPARTMENT_OTHER): Payer: BC Managed Care – PPO | Attending: Family Medicine | Admitting: Physical Therapy

## 2020-12-09 DIAGNOSIS — G8929 Other chronic pain: Secondary | ICD-10-CM | POA: Insufficient documentation

## 2020-12-09 DIAGNOSIS — M545 Low back pain, unspecified: Secondary | ICD-10-CM | POA: Diagnosis not present

## 2020-12-09 DIAGNOSIS — R252 Cramp and spasm: Secondary | ICD-10-CM | POA: Insufficient documentation

## 2020-12-09 NOTE — Therapy (Signed)
[facility] [address] [place], [place], [address] Phone: [phone]   Fax:  [phone]  Physical Therapy Treatment  Patient Details  Name: Willie Kent MRN: [id number] Date of Birth: 22-Dec-1985 Referring Provider (PT): Garth Bigness MD Marland KitchenFarris Has is PCP)   Encounter Date: 12/09/2020   PT End of Session - 12/09/20 0938    Visit Number 2    Date for PT Re-Evaluation 01/20/21    PT Start Time 0801    PT Stop Time 0845    PT Time Calculation (min) 44 min    Activity Tolerance Patient tolerated treatment well    Behavior During Therapy [facility] for tasks assessed/performed           Past Medical History:  Diagnosis Date  . Hypertension     Past Surgical History:  Procedure Laterality Date  . TONSILLECTOMY      There were no vitals filed for this visit.   Subjective Assessment - 12/09/20 0802    Subjective Pt states his back has been mostly fine. Pt states downward dog and plank has been the most challenging due to his tight hamstrings.    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                             [facility] Adult PT Treatment/Exercise - 12/09/20 0001      Exercises   Exercises Lumbar      Lumbar Exercises: Stretches   Active Hamstring Stretch Right;Left;30 seconds    Single Knee to Chest Stretch Right;Left;30 seconds      Lumbar Exercises: Standing   Other Standing Lumbar Exercises Squat 23# kettlebell 3x10    Other Standing Lumbar Exercises Deadlift 13# kettlebell 3x10 off aerobic step      Lumbar Exercises: Supine   Other Supine Lumbar Exercises Supine with pball at feet rollout forward & circles 2x10      Lumbar Exercises: Prone   Opposite Arm/Leg Raise Right arm/Left leg;Left arm/Right leg;20 reps    Other Prone Lumbar Exercises Forward plank on  elbows 2x30 sec    Other Prone Lumbar Exercises Side plank on elbow x30 sec                    PT Short Term Goals - 11/25/20 1251      PT SHORT TERM GOAL #1   Title Ind with initial HEP    Time 4    Period Weeks    Status New    Target Date 12/23/20      PT SHORT TERM GOAL #2   Title Patient will report a 30% improvement in back pain and spasm    Time 4    Period Weeks    Status New      PT SHORT TERM GOAL #3   Title --             PT Long Term Goals - 11/25/20 1252      PT LONG TERM GOAL #1   Title The patient will be independent in safe self progression of HEP for further improvements in ROM, strength and pain and to decrease chance of recurrence    Time 8    Period Weeks    Status New    Target Date 01/20/21  PT LONG TERM GOAL #2   Title The patient will report a 90% improvement in pain and spasm with home and work ADLs    Time 8    Period Weeks    Status New      PT LONG TERM GOAL #3   Title patient able to demo improved core strength with progression of planks/side planks and functional stabilization TE.    Time 8    Period Weeks    Status New      PT LONG TERM GOAL #4   Title -                 Plan - 12/09/20 0937    Clinical Impression Statement Treatment focused on modifying and advancing his HEP. Initiated functional lifting with squats and deadlifts. Pt tolerated exercises well.    Personal Factors and Comorbidities Comorbidity 1    Comorbidities obesity    Stability/Clinical Decision Making Stable/Uncomplicated    Rehab Potential Excellent    PT Frequency 2x / week   probably 1x/wk per pt   PT Duration 8 weeks    PT Treatment/Interventions ADLs/Self Care Home Management;Cryotherapy;Electrical Stimulation;Iontophoresis 4mg /ml Dexamethasone;Moist Heat;Neuromuscular re-education;Therapeutic exercise;Therapeutic activities;Patient/family education;Manual techniques;Dry needling;Taping    PT Next Visit Plan lumbar and LE  flexibilty; body mechanics; lumbar stabilization; progress to functional strengthening    PT Home Exercise Plan [email or web address]    Consulted and Agree with Plan of Care Patient           Patient will benefit from skilled therapeutic intervention in order to improve the following deficits and impairments:  Decreased range of motion,Obesity,Increased muscle spasms,Pain,Decreased strength,Impaired flexibility,Hypermobility  Visit Diagnosis: Cramp and spasm  Chronic low back pain, unspecified back pain laterality, unspecified whether sciatica present     Problem List There are no problems to display for this patient.   Felicia Bloomquist April Ma L Tricia Pledger PT, DPT 12/09/2020, 9:39 AM  [facility] [address] [place], [place], [place] Phone: [phone]   Fax:  [phone]  Name: Willie Kent MRN: Cletus Gash Date of Birth: 1986/01/01

## 2020-12-16 ENCOUNTER — Ambulatory Visit (HOSPITAL_BASED_OUTPATIENT_CLINIC_OR_DEPARTMENT_OTHER): Payer: BC Managed Care – PPO | Attending: Family Medicine | Admitting: Physical Therapy

## 2020-12-16 ENCOUNTER — Other Ambulatory Visit: Payer: Self-pay

## 2020-12-16 DIAGNOSIS — M545 Low back pain, unspecified: Secondary | ICD-10-CM | POA: Insufficient documentation

## 2020-12-16 DIAGNOSIS — R252 Cramp and spasm: Secondary | ICD-10-CM | POA: Insufficient documentation

## 2020-12-16 DIAGNOSIS — G8929 Other chronic pain: Secondary | ICD-10-CM | POA: Insufficient documentation

## 2020-12-16 NOTE — Therapy (Signed)
[facility] [address] [place], [place], [address] Phone: [phone]   Fax:  [phone]  Physical Therapy Treatment  Patient Details   Name: Willie Kent MRN: [id number] Date of Birth: 1986-08-12 Referring Provider (PT): Garth Bigness MD Marland KitchenFarris Has is PCP)   Encounter Date: 12/16/2020   PT End of Session - 12/16/20 0835    Visit Number 3    Date for PT Re-Evaluation 01/20/21    Authorization Type BCBS    PT Start Time 0801    PT Stop Time 0845    PT Time Calculation (min) 44 min    Activity Tolerance Patient tolerated treatment well    Behavior During Therapy [facility] for tasks assessed/performed           Past Medical History:  Diagnosis Date  . Hypertension     Past Surgical History:  Procedure Laterality Date  . TONSILLECTOMY      There were no vitals filed for this visit.   Subjective Assessment - 12/16/20 0806    Subjective Pt reports he was sore for a few days but otherwise had no issues after last session. Pt states his hamstrings have been more flexible.    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    Currently in Pain? No/denies                             [facility] Adult PT Treatment/Exercise - 12/16/20 0001      Lumbar Exercises: Stretches   Single Knee to Chest Stretch Right;Left;30 seconds    Prone on Elbows Stretch 30 seconds    Piriformis Stretch Right;Left;30 seconds    Other Lumbar Stretch Exercise Child's pose x30 sec with side bend x30 sec each      Lumbar Exercises: Aerobic   Recumbent Bike [employer] x6 min      Lumbar Exercises: Machines for Strengthening   Cybex Knee Extension 110# x10; 210# x10; 250# x10    Other Lumbar Machine Exercise Palloff press 3x10 bilat @ 20#    Other Lumbar Machine Exercise Shoulder extensions 40# 3x10       Lumbar Exercises: Standing   Shoulder Extension --    Other Standing Lumbar Exercises --      Lumbar Exercises: Prone   Other Prone Lumbar Exercises Forward plank on elbows 1x max hold;   max hold 40 sec   Other Prone Lumbar Exercises Side plank on elbow 2x20 sec                    PT Short Term Goals - 12/16/20 0834      PT SHORT TERM GOAL #1   Title Ind with initial HEP    Time 4    Period Weeks    Status Achieved    Target Date 12/23/20      PT SHORT TERM GOAL #2   Title Patient will report a 30% improvement in back pain and spasm    Time 4    Period Weeks    Status Achieved      PT SHORT TERM GOAL #3   Title --             PT Long Term Goals - 11/25/20 1252      PT LONG TERM GOAL #1  Title The patient will be independent in safe self progression of HEP for further improvements in ROM, strength and pain and to decrease chance of recurrence    Time 8    Period Weeks    Status New    Target Date 01/20/21      PT LONG TERM GOAL #2   Title The patient will report a 90% improvement in pain and spasm with home and work ADLs    Time 8    Period Weeks    Status New      PT LONG TERM GOAL #3   Title patient able to demo improved core strength with progression of planks/side planks and functional stabilization TE.    Time 8    Period Weeks    Status New      PT LONG TERM GOAL #4   Title -                 Plan - 12/16/20 0820    Clinical Impression Statement Treatment focused on continuing to advance core strengthening with gym equipment. Continued to progress planks and functional lifts.    Personal Factors and Comorbidities Comorbidity 1    Comorbidities obesity    Stability/Clinical Decision Making Stable/Uncomplicated    Rehab Potential Excellent    PT Frequency 2x / week   probably 1x/wk per pt   PT Duration 8 weeks    PT Treatment/Interventions ADLs/Self Care Home Management;Cryotherapy;Electrical Stimulation;Iontophoresis 4mg /ml  Dexamethasone;Moist Heat;Neuromuscular re-education;Therapeutic exercise;Therapeutic activities;Patient/family education;Manual techniques;Dry needling;Taping    PT Next Visit Plan lumbar and LE flexibilty; body mechanics; lumbar stabilization; progress to functional strengthening    PT Home Exercise Plan [id number]    Consulted and Agree with Plan of Care Patient           Patient will benefit from skilled therapeutic intervention in order to improve the following deficits and impairments:  Decreased range of motion,Obesity,Increased muscle spasms,Pain,Decreased strength,Impaired flexibility,Hypermobility  Visit Diagnosis: Cramp and spasm  Chronic low back pain, unspecified back pain laterality, unspecified whether sciatica present     Problem List There are no problems to display for this patient.   [facility] [address] PT, DPT 12/16/2020, 8:46 AM  [facility] [address] [place], [place], [place] Phone: [phone]   Fax:  [phone]  Name: Willie Kent MRN: Cletus Gash Date of Birth: 06-28-1986

## 2020-12-23 ENCOUNTER — Ambulatory Visit (HOSPITAL_BASED_OUTPATIENT_CLINIC_OR_DEPARTMENT_OTHER): Payer: BC Managed Care – PPO | Attending: Family Medicine | Admitting: Physical Therapy

## 2020-12-23 ENCOUNTER — Other Ambulatory Visit: Payer: Self-pay

## 2020-12-23 DIAGNOSIS — R252 Cramp and spasm: Secondary | ICD-10-CM | POA: Insufficient documentation

## 2020-12-23 DIAGNOSIS — G8929 Other chronic pain: Secondary | ICD-10-CM | POA: Insufficient documentation

## 2020-12-23 DIAGNOSIS — M545 Low back pain, unspecified: Secondary | ICD-10-CM | POA: Diagnosis not present

## 2020-12-23 NOTE — Therapy (Signed)
[facility] [address] [place], [place], [address] Phone: [phone]   Fax:  [phone]  Physical Therapy Treatment  Patient Details  Name: Willie Kent MRN: [id number] Date of Birth: Sep 19, 1986 Referring Provider (PT): Garth Bigness MD Marland KitchenFarris Has is PCP)   Encounter Date: 12/23/2020   PT End of Session - 12/23/20 0804    Visit Number 4    Date for PT Re-Evaluation 01/20/21    Authorization Type BCBS    PT Start Time 0803    PT Stop Time 0845    PT Time Calculation (min) 42 min    Activity Tolerance Patient tolerated treatment well    Behavior During Therapy [facility] for tasks assessed/performed           Past Medical History:  Diagnosis Date  . Hypertension     Past Surgical History:  Procedure Laterality Date  . TONSILLECTOMY      There were no vitals filed for this visit.   Subjective Assessment - 12/23/20 0805    Subjective Pt reports no new complaints.    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    Currently in Pain? No/denies                             [facility] Adult PT Treatment/Exercise - 12/23/20 0001      Lumbar Exercises: Aerobic   Recumbent B[employer] x6 min      Lumbar Exercises: Machines for Strengthening   Other Lumbar Machine Exercise Palloff press 3x10 bilat @ 25#, diagonal chops 3x10 bilat @ 20#    Other Lumbar Machine Exercise Shoulder extensions 40# 3x10      Lumbar Exercises: Standing   Other Standing Lumbar Exercises Squat 26# kettlebell x10; 30# x10; 35# x10    Other Standing Lumbar Exercises Deadlift 13#, 15# x10 off aerobic step; Single leg deadlift 13# 2x10                    PT Short Term Goals - 12/16/20 0834      PT SHORT TERM GOAL #1   Title Ind with initial HEP    Time 4    Period Weeks    Status Achieved     Target Date 12/23/20      PT SHORT TERM GOAL #2   Title Patient will report a 30% improvement in back pain and spasm    Time 4    Period Weeks    Status Achieved      PT SHORT TERM GOAL #3   Title --             PT Long Term Goals - 11/25/20 1252      PT LONG TERM GOAL #1   Title The patient will be independent in safe self progression of HEP for further improvements in ROM, strength and pain and to decrease chance of recurrence    Time 8    Period Weeks    Status New    Target Date 01/20/21      PT LONG TERM GOAL #2   Title The patient will report a 90% improvement in pain and spasm with home and work ADLs    Time 8    Period Weeks    Status New  PT LONG TERM GOAL #3   Title patient able to demo improved core strength with progression of planks/side planks and functional stabilization TE.    Time 8    Period Weeks    Status New      PT LONG TERM GOAL #4   Title -                 Plan - 12/23/20 0818    Clinical Impression Statement Treatment session focused on continuing to progress core strengthening with gym equipment. Progressed pt's functional lifts. Did not further progress planks as pt remains challenged with alternating hip extension.    Personal Factors and Comorbidities Comorbidity 1    Comorbidities obesity    Stability/Clinical Decision Making Stable/Uncomplicated    Rehab Potential Excellent    PT Frequency 2x / week   probably 1x/wk per pt   PT Duration 8 weeks    PT Treatment/Interventions ADLs/Self Care Home Management;Cryotherapy;Electrical Stimulation;Iontophoresis 4mg /ml Dexamethasone;Moist Heat;Neuromuscular re-education;Therapeutic exercise;Therapeutic activities;Patient/family education;Manual techniques;Dry needling;Taping    PT Next Visit Plan lumbar and LE flexibilty; body mechanics; lumbar stabilization; progress to functional strengthening    PT Home Exercise Plan [id number]    Consulted and Agree with Plan of Care Patient            Patient will benefit from skilled therapeutic intervention in order to improve the following deficits and impairments:  Decreased range of motion,Obesity,Increased muscle spasms,Pain,Decreased strength,Impaired flexibility,Hypermobility  Visit Diagnosis: Cramp and spasm  Chronic low back pain, unspecified back pain laterality, unspecified whether sciatica present     Problem List There are no problems to display for this patient.    Olive Zmuda April Ma L Kabrina Christiano  PT, DPT 12/23/2020, 8:37 AM  [facility] [address] [place], [place], [place] Phone: [phone]   Fax:  [phone]  Name: Willie Kent MRN: Cletus Gash Date of Birth: 1986/06/05

## 2020-12-30 ENCOUNTER — Ambulatory Visit (HOSPITAL_BASED_OUTPATIENT_CLINIC_OR_DEPARTMENT_OTHER): Payer: BC Managed Care – PPO | Admitting: Physical Therapy

## 2021-01-06 ENCOUNTER — Ambulatory Visit (HOSPITAL_BASED_OUTPATIENT_CLINIC_OR_DEPARTMENT_OTHER): Payer: BC Managed Care – PPO | Attending: Family Medicine | Admitting: Physical Therapy

## 2021-01-06 ENCOUNTER — Other Ambulatory Visit: Payer: Self-pay

## 2021-01-06 DIAGNOSIS — M545 Low back pain, unspecified: Secondary | ICD-10-CM

## 2021-01-06 DIAGNOSIS — M546 Pain in thoracic spine: Secondary | ICD-10-CM | POA: Diagnosis not present

## 2021-01-06 DIAGNOSIS — G8929 Other chronic pain: Secondary | ICD-10-CM

## 2021-01-06 DIAGNOSIS — R252 Cramp and spasm: Secondary | ICD-10-CM

## 2021-01-06 NOTE — Therapy (Addendum)
[facility] °[address] °[place], [place], [address] °Phone: [phone]   Fax:  [phone] ° °Physical Therapy Treatment and Discharge ° °Patient Details  °Name: Willie Kent °MRN: [id number] °Date of Birth: 08/30/1986 °Referring Provider (PT): Kathryn Timberlake MD (Aaron Morrow is PCP) ° °PHYSICAL THERAPY DISCHARGE SUMMARY ° °Visits from Start of Care: 5 ° °Current functional level related to goals / functional outcomes: °See below °  °Remaining deficits: °See below °  °Education / Equipment: °See below  °Plan: °Patient agrees to discharge.  Patient goals were met. Patient is being discharged due to meeting the stated rehab goals.    ° ° ° ° °Encounter Date: 01/06/2021 ° ° PT End of Session - 01/06/21 0814   ° ° Visit Number 5   ° Date for PT Re-Evaluation 01/20/21   ° Authorization Type BCBS   ° PT Start Time 0806   ° PT Stop Time 0845   ° PT Time Calculation (min) 39 min   ° Activity Tolerance Patient tolerated treatment well   ° Behavior During Therapy [facility] for tasks assessed/performed   ° °  °  ° °  ° ° °Past Medical History:  °Diagnosis Date  ° Hypertension   ° ° °Past Surgical History:  °Procedure Laterality Date  ° TONSILLECTOMY    ° ° °There were no vitals filed for this visit. ° ° Subjective Assessment - 01/06/21 0812   ° ° Subjective Pt reports nothing new. Pt has tried the plank with hip extension -- it remains challenging.   ° Pertinent History HTN, Rt ACL/MCL tear (no surgery) 2001   ° Patient Stated Goals Looking to get stronger so he can get back to working out and not hurt functionally.   ° Currently in Pain? No/denies   ° °  °  ° °  ° ° ° ° ° ° ° ° ° ° ° ° ° ° ° ° ° ° ° ° [facility] Adult PT Treatment/Exercise - 01/06/21 0001   ° °  ° Lumbar Exercises: Stretches  ° Piriformis Stretch Right;Left;30 seconds   ° Figure 4 Stretch 2 reps;30 seconds;Supine;With overpressure   °  ° Lumbar Exercises: Aerobic  ° Recumbent Bike [employer] x5 min   °  ° Lumbar  Exercises: Machines for Strengthening  ° Other Lumbar Machine Exercise Palloff press 3x10 bilat @ 30#, diagonal chops 3x10 bilat @ 25#   °  ° Lumbar Exercises: Standing  ° Other Standing Lumbar Exercises Squat 40# kettlebell 3 x10   cues for parallel tibia & low back  ° Other Standing Lumbar Exercises Deadlift 20# 3x10 off aerobic step   ° °  °  ° °  ° ° ° ° ° ° ° ° ° ° ° PT Short Term Goals - 12/16/20 0834   ° °  ° PT SHORT TERM GOAL #1  ° Title Ind with initial HEP   ° Time 4   ° Period Weeks   ° Status Achieved   ° Target Date 12/23/20   °  ° PT SHORT TERM GOAL #2  ° Title Patient will report a 30% improvement in back pain and spasm   ° Time 4   ° Period Weeks   ° Status Achieved   °  ° PT SHORT TERM GOAL #3  ° Title --   ° °  °  ° °  ° ° ° ° PT Long Term Goals - 11/25/20 1252   ° °  °   PT LONG TERM GOAL #1   Title The patient will be independent in safe self progression of HEP for further improvements in ROM, strength and pain and to decrease chance of recurrence    Time 8    Period Weeks    Status New    Target Date 01/20/21      PT LONG TERM GOAL #2   Title The patient will report a 90% improvement in pain and spasm with home and work ADLs    Time 8    Period Weeks    Status New      PT LONG TERM GOAL #3   Title patient able to demo improved core strength with progression of planks/side planks and functional stabilization TE.    Time 8    Period Weeks    Status New      PT LONG TERM GOAL #4   Title -                   Plan - 01/06/21 0823     Clinical Impression Statement Treatment continues to focus on increasing weight to progress core strengthening and functional lifting. Pt continues to tolerate exercises well with no spasms.    Personal Factors and Comorbidities Comorbidity 1    Comorbidities obesity    Stability/Clinical Decision Making Stable/Uncomplicated    Rehab Potential Excellent    PT Frequency 2x / week   probably 1x/wk per pt   PT Duration 8 weeks    PT  Treatment/Interventions ADLs/Self Care Home Management;Cryotherapy;Electrical Stimulation;Iontophoresis 67m/ml Dexamethasone;Moist Heat;Neuromuscular re-education;Therapeutic exercise;Therapeutic activities;Patient/family education;Manual techniques;Dry needling;Taping    PT Next Visit Plan lumbar and LE flexibilty; body mechanics; lumbar stabilization; progress to functional strengthening    PT Home Exercise Plan [id number]    Consulted and Agree with Plan of Care Patient             Patient will benefit from skilled therapeutic intervention in order to improve the following deficits and impairments:  Decreased range of motion,Obesity,Increased muscle spasms,Pain,Decreased strength,Impaired flexibility,Hypermobility  Visit Diagnosis: Cramp and spasm  Chronic low back pain, unspecified back pain laterality, unspecified whether sciatica present     Problem List There are no problems to display for this patient.   [facility]A[address]PT, DPT 01/06/2021, 8:43 AM  [facility]32 Wagon DriveGRay[place] [address]Phone: [phone]  Fax:  [phone] Name: KCleven JansmaMRN: [id number] of Birth: [birth year]

## 2021-02-18 DIAGNOSIS — Z20822 Contact with and (suspected) exposure to covid-19: Secondary | ICD-10-CM | POA: Diagnosis not present

## 2021-06-25 DIAGNOSIS — I10 Essential (primary) hypertension: Secondary | ICD-10-CM | POA: Diagnosis not present

## 2021-06-30 DIAGNOSIS — Z3009 Encounter for other general counseling and advice on contraception: Secondary | ICD-10-CM | POA: Diagnosis not present

## 2021-07-15 DIAGNOSIS — E876 Hypokalemia: Secondary | ICD-10-CM | POA: Diagnosis not present

## 2021-07-15 DIAGNOSIS — R7309 Other abnormal glucose: Secondary | ICD-10-CM | POA: Diagnosis not present

## 2021-07-28 DIAGNOSIS — R509 Fever, unspecified: Secondary | ICD-10-CM | POA: Diagnosis not present

## 2021-07-28 DIAGNOSIS — R0981 Nasal congestion: Secondary | ICD-10-CM | POA: Diagnosis not present

## 2021-07-28 DIAGNOSIS — R197 Diarrhea, unspecified: Secondary | ICD-10-CM | POA: Diagnosis not present

## 2021-07-28 DIAGNOSIS — Z03818 Encounter for observation for suspected exposure to other biological agents ruled out: Secondary | ICD-10-CM | POA: Diagnosis not present

## 2021-07-28 DIAGNOSIS — R051 Acute cough: Secondary | ICD-10-CM | POA: Diagnosis not present

## 2021-07-30 DIAGNOSIS — K529 Noninfective gastroenteritis and colitis, unspecified: Secondary | ICD-10-CM | POA: Diagnosis not present

## 2021-08-06 DIAGNOSIS — Z302 Encounter for sterilization: Secondary | ICD-10-CM | POA: Diagnosis not present

## 2021-09-09 DIAGNOSIS — Z03818 Encounter for observation for suspected exposure to other biological agents ruled out: Secondary | ICD-10-CM | POA: Diagnosis not present

## 2021-09-09 DIAGNOSIS — J209 Acute bronchitis, unspecified: Secondary | ICD-10-CM | POA: Diagnosis not present

## 2021-09-09 DIAGNOSIS — R051 Acute cough: Secondary | ICD-10-CM | POA: Diagnosis not present

## 2021-09-22 DIAGNOSIS — E876 Hypokalemia: Secondary | ICD-10-CM | POA: Diagnosis not present

## 2021-11-12 DIAGNOSIS — M79672 Pain in left foot: Secondary | ICD-10-CM | POA: Diagnosis not present

## 2021-11-12 DIAGNOSIS — I10 Essential (primary) hypertension: Secondary | ICD-10-CM | POA: Diagnosis not present

## 2021-11-23 DIAGNOSIS — M79672 Pain in left foot: Secondary | ICD-10-CM | POA: Diagnosis not present

## 2021-11-25 ENCOUNTER — Other Ambulatory Visit: Payer: Self-pay | Admitting: Sports Medicine

## 2021-11-25 ENCOUNTER — Ambulatory Visit
Admission: RE | Admit: 2021-11-25 | Discharge: 2021-11-25 | Disposition: A | Discharge status: Home / Self Care | Payer: BC Managed Care – PPO | Source: Ambulatory Visit | Attending: Sports Medicine | Admitting: Sports Medicine

## 2021-11-25 ENCOUNTER — Other Ambulatory Visit: Payer: Self-pay

## 2021-11-25 DIAGNOSIS — M79672 Pain in left foot: Secondary | ICD-10-CM

## 2022-01-06 DIAGNOSIS — I10 Essential (primary) hypertension: Secondary | ICD-10-CM | POA: Diagnosis not present

## 2022-06-06 DIAGNOSIS — M545 Low back pain, unspecified: Secondary | ICD-10-CM | POA: Diagnosis not present

## 2022-06-07 DIAGNOSIS — I10 Essential (primary) hypertension: Secondary | ICD-10-CM | POA: Diagnosis not present

## 2022-06-07 DIAGNOSIS — M549 Dorsalgia, unspecified: Secondary | ICD-10-CM | POA: Diagnosis not present

## 2022-06-07 DIAGNOSIS — M5126 Other intervertebral disc displacement, lumbar region: Secondary | ICD-10-CM | POA: Diagnosis not present

## 2022-06-07 DIAGNOSIS — M5116 Intervertebral disc disorders with radiculopathy, lumbar region: Secondary | ICD-10-CM | POA: Diagnosis not present

## 2022-06-07 DIAGNOSIS — Z880 Allergy status to penicillin: Secondary | ICD-10-CM | POA: Diagnosis not present

## 2022-06-07 DIAGNOSIS — R52 Pain, unspecified: Secondary | ICD-10-CM | POA: Diagnosis not present

## 2022-06-07 DIAGNOSIS — R202 Paresthesia of skin: Secondary | ICD-10-CM | POA: Diagnosis not present

## 2022-06-07 DIAGNOSIS — M545 Low back pain, unspecified: Secondary | ICD-10-CM | POA: Diagnosis not present

## 2022-06-07 DIAGNOSIS — Y92410 Unspecified street and highway as the place of occurrence of the external cause: Secondary | ICD-10-CM | POA: Diagnosis not present

## 2022-06-07 DIAGNOSIS — I959 Hypotension, unspecified: Secondary | ICD-10-CM | POA: Diagnosis not present

## 2022-06-15 DIAGNOSIS — M549 Dorsalgia, unspecified: Secondary | ICD-10-CM | POA: Diagnosis not present

## 2022-07-07 DIAGNOSIS — Z13220 Encounter for screening for lipoid disorders: Secondary | ICD-10-CM | POA: Diagnosis not present

## 2022-07-07 DIAGNOSIS — Z23 Encounter for immunization: Secondary | ICD-10-CM | POA: Diagnosis not present

## 2022-07-07 DIAGNOSIS — I10 Essential (primary) hypertension: Secondary | ICD-10-CM | POA: Diagnosis not present

## 2022-07-07 DIAGNOSIS — Z0000 Encounter for general adult medical examination without abnormal findings: Secondary | ICD-10-CM | POA: Diagnosis not present

## 2022-07-18 DIAGNOSIS — R0981 Nasal congestion: Secondary | ICD-10-CM | POA: Diagnosis not present

## 2022-07-18 DIAGNOSIS — R051 Acute cough: Secondary | ICD-10-CM | POA: Diagnosis not present

## 2022-08-08 DIAGNOSIS — R519 Headache, unspecified: Secondary | ICD-10-CM | POA: Diagnosis not present

## 2022-08-11 DIAGNOSIS — I10 Essential (primary) hypertension: Secondary | ICD-10-CM | POA: Diagnosis not present

## 2022-08-11 DIAGNOSIS — J0110 Acute frontal sinusitis, unspecified: Secondary | ICD-10-CM | POA: Diagnosis not present

## 2022-08-16 DIAGNOSIS — M545 Low back pain, unspecified: Secondary | ICD-10-CM | POA: Diagnosis not present

## 2022-09-07 DIAGNOSIS — M2560 Stiffness of unspecified joint, not elsewhere classified: Secondary | ICD-10-CM | POA: Diagnosis not present

## 2022-09-07 DIAGNOSIS — M5416 Radiculopathy, lumbar region: Secondary | ICD-10-CM | POA: Diagnosis not present

## 2022-09-19 DIAGNOSIS — M2560 Stiffness of unspecified joint, not elsewhere classified: Secondary | ICD-10-CM | POA: Diagnosis not present

## 2022-09-19 DIAGNOSIS — M5416 Radiculopathy, lumbar region: Secondary | ICD-10-CM | POA: Diagnosis not present

## 2022-09-21 DIAGNOSIS — M5416 Radiculopathy, lumbar region: Secondary | ICD-10-CM | POA: Diagnosis not present

## 2022-09-21 DIAGNOSIS — M2560 Stiffness of unspecified joint, not elsewhere classified: Secondary | ICD-10-CM | POA: Diagnosis not present

## 2022-09-28 DIAGNOSIS — M2560 Stiffness of unspecified joint, not elsewhere classified: Secondary | ICD-10-CM | POA: Diagnosis not present

## 2022-09-28 DIAGNOSIS — M5416 Radiculopathy, lumbar region: Secondary | ICD-10-CM | POA: Diagnosis not present

## 2022-10-05 DIAGNOSIS — M5416 Radiculopathy, lumbar region: Secondary | ICD-10-CM | POA: Diagnosis not present

## 2022-10-05 DIAGNOSIS — M2560 Stiffness of unspecified joint, not elsewhere classified: Secondary | ICD-10-CM | POA: Diagnosis not present

## 2022-12-07 DIAGNOSIS — M5416 Radiculopathy, lumbar region: Secondary | ICD-10-CM | POA: Diagnosis not present

## 2023-01-24 DIAGNOSIS — I10 Essential (primary) hypertension: Secondary | ICD-10-CM | POA: Diagnosis not present

## 2023-03-09 DIAGNOSIS — I10 Essential (primary) hypertension: Secondary | ICD-10-CM | POA: Diagnosis not present

## 2023-04-13 DIAGNOSIS — E876 Hypokalemia: Secondary | ICD-10-CM | POA: Diagnosis not present

## 2023-06-20 IMAGING — CR DG FOOT COMPLETE 3+V*L*
3 series · 3 of 3 positions shown · non-contrast
Comparison: None.

CLINICAL DATA: Left foot pain since 10/16/2021.  No injury.

EXAM:
LEFT FOOT - COMPLETE 3+ VIEW

[x foot ap left]
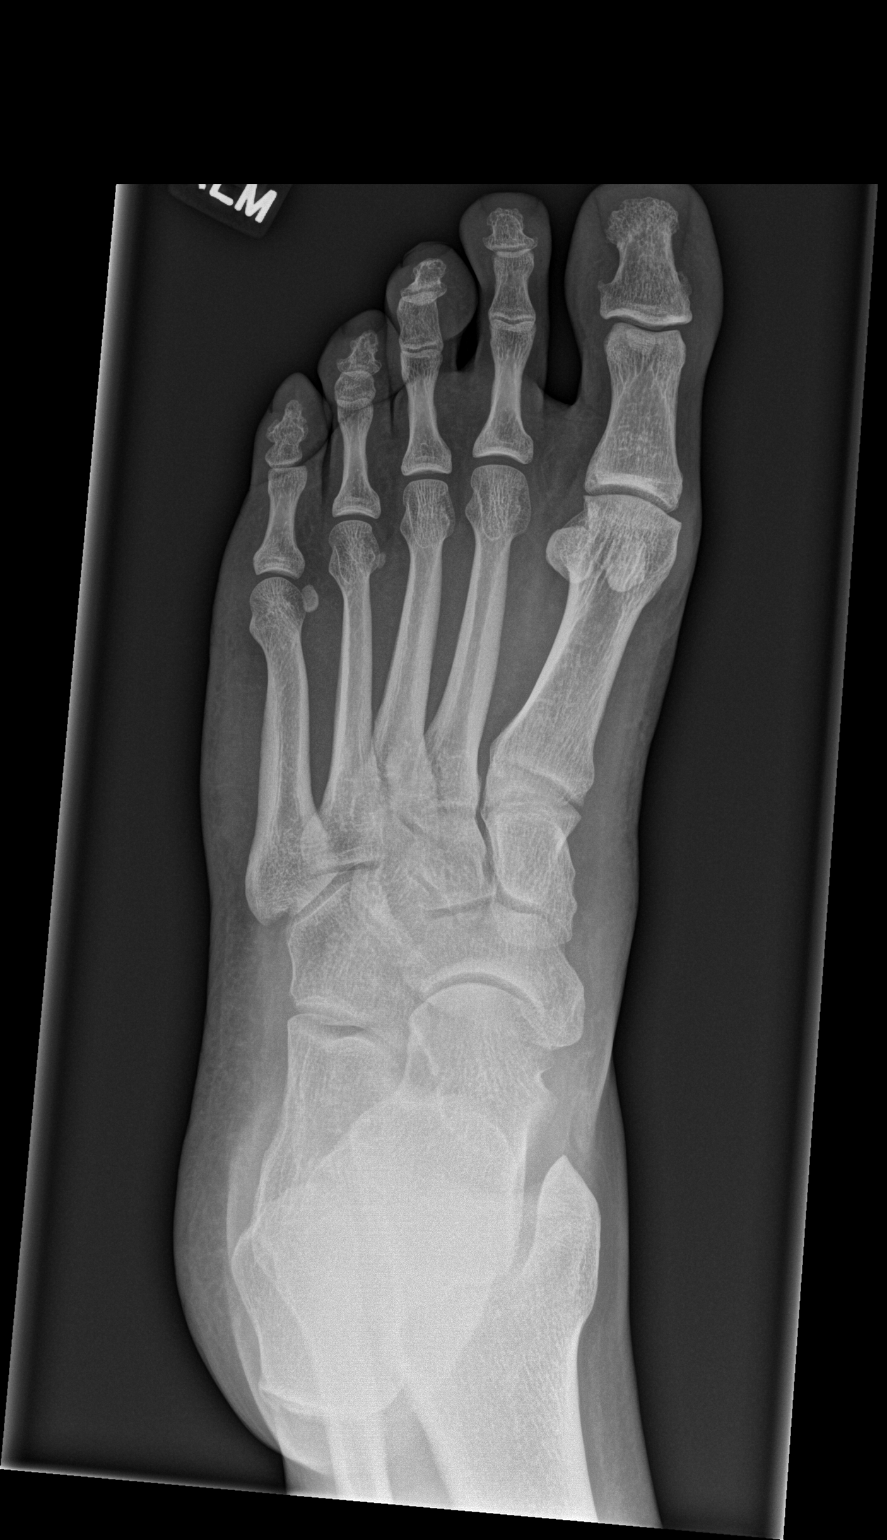

[x foot obl left]
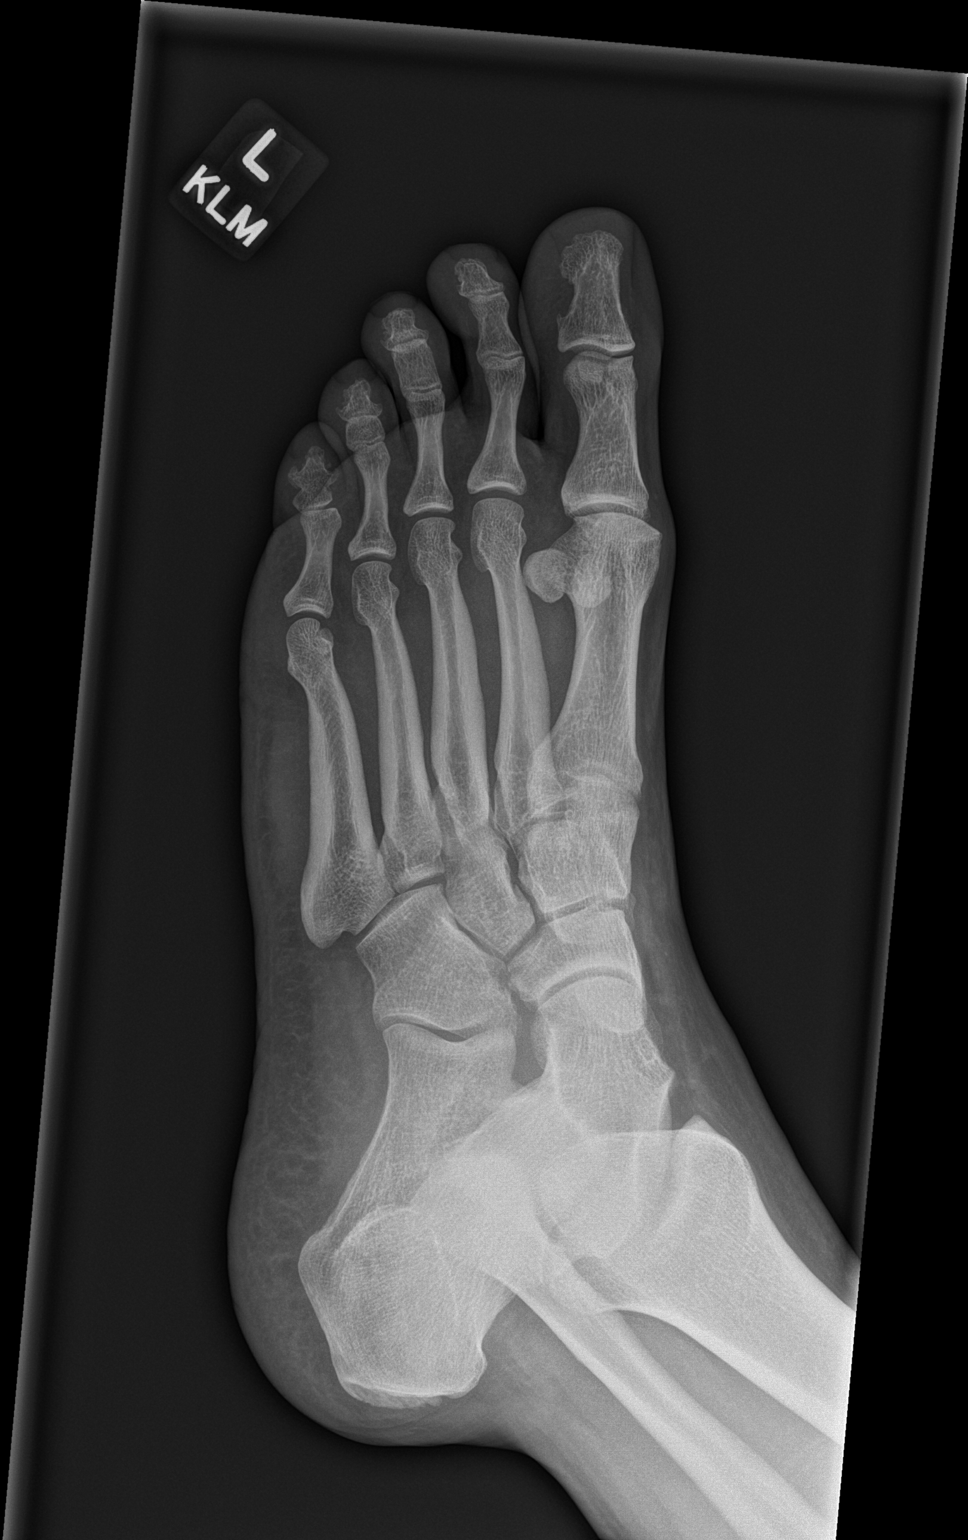

[x foot lat left]
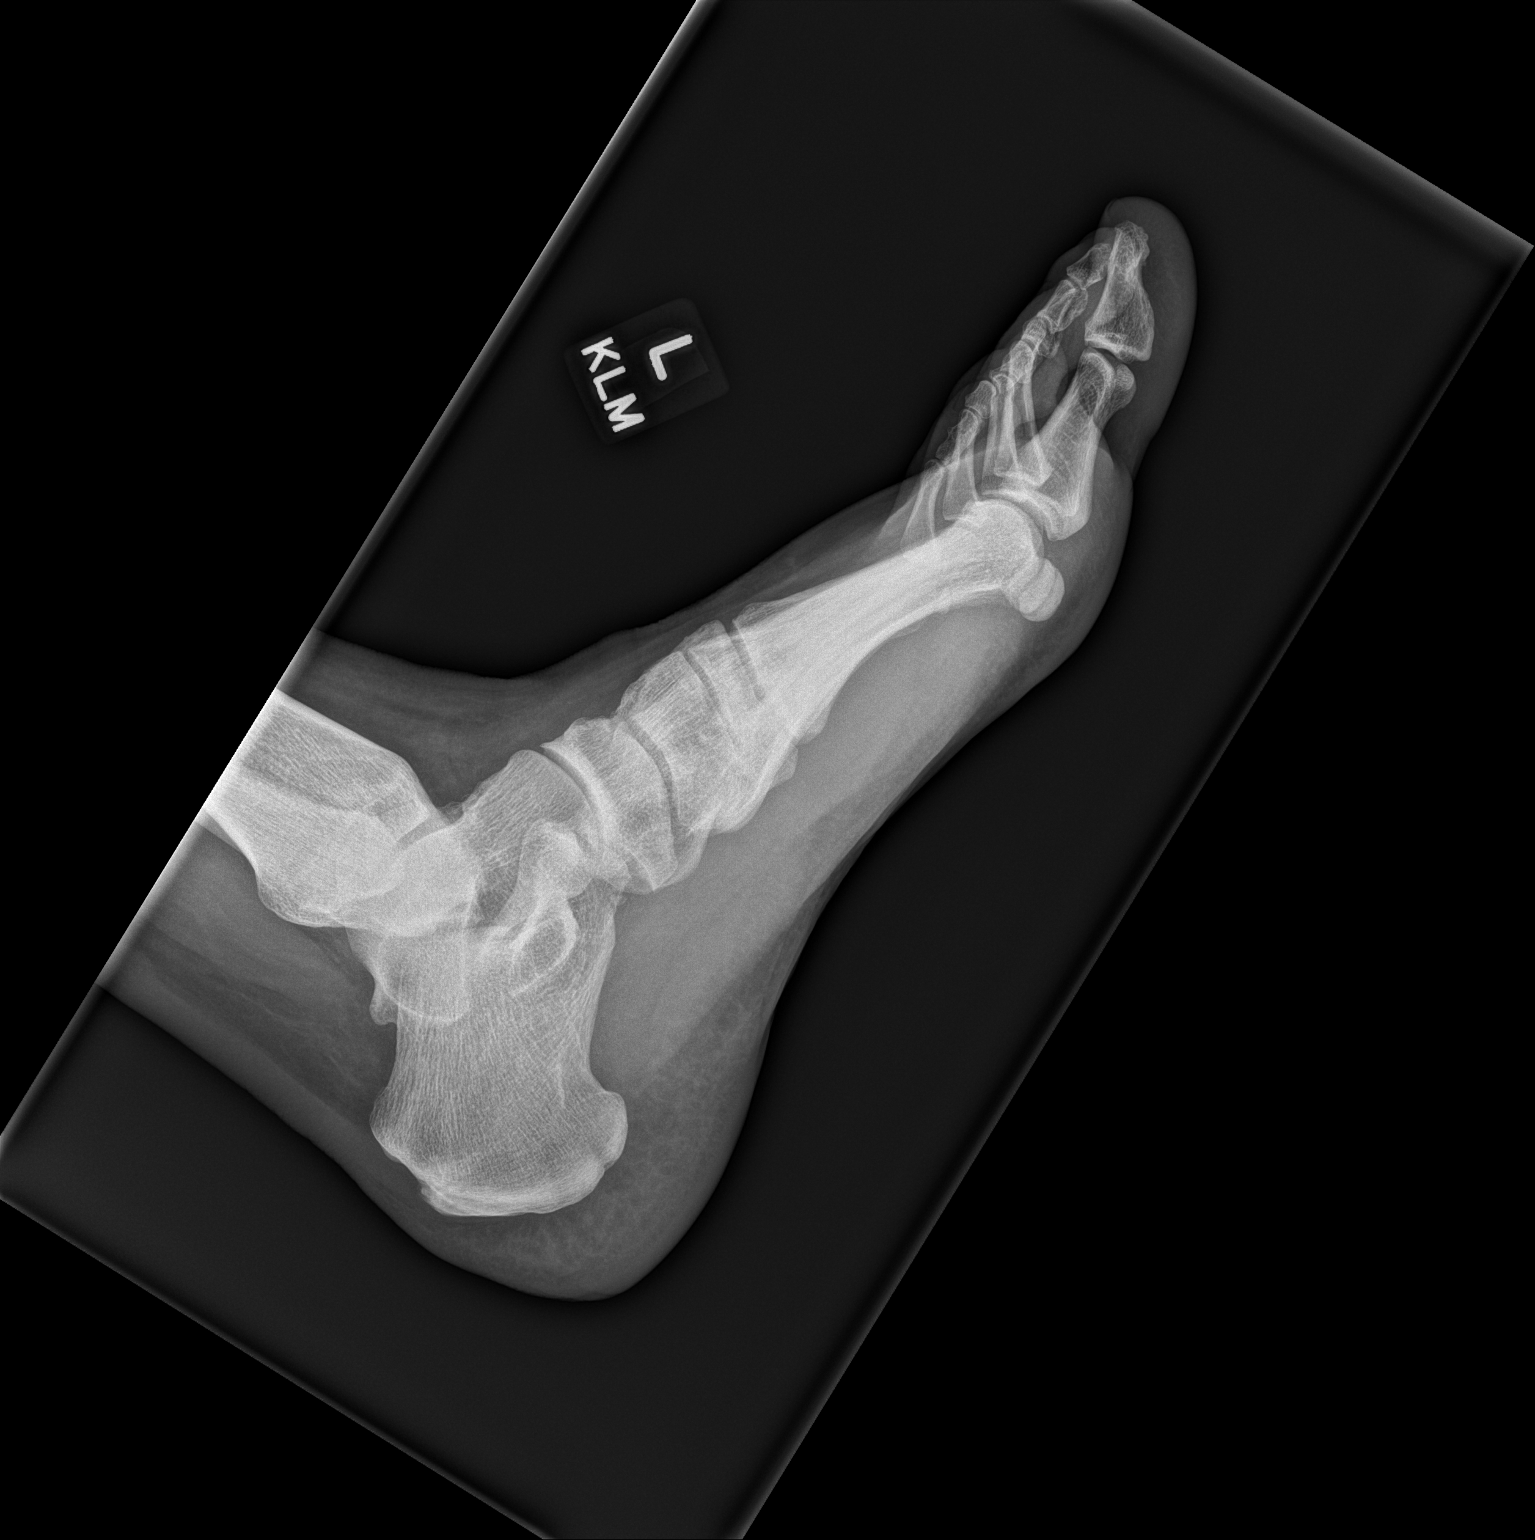

[3 of 3 positions shown; findings below may reference images not displayed]

FINDINGS: There is no evidence of fracture or dislocation. There is no
evidence of arthropathy or other focal bone abnormality. Soft
tissues are unremarkable.
IMPRESSION: Negative.

## 2023-09-11 DIAGNOSIS — F4323 Adjustment disorder with mixed anxiety and depressed mood: Secondary | ICD-10-CM | POA: Diagnosis not present

## 2023-10-05 DIAGNOSIS — F4323 Adjustment disorder with mixed anxiety and depressed mood: Secondary | ICD-10-CM | POA: Diagnosis not present

## 2023-12-24 DIAGNOSIS — M79671 Pain in right foot: Secondary | ICD-10-CM | POA: Diagnosis not present

## 2023-12-24 DIAGNOSIS — M7731 Calcaneal spur, right foot: Secondary | ICD-10-CM | POA: Diagnosis not present

## 2023-12-24 DIAGNOSIS — M19071 Primary osteoarthritis, right ankle and foot: Secondary | ICD-10-CM | POA: Diagnosis not present

## 2024-01-31 DIAGNOSIS — E876 Hypokalemia: Secondary | ICD-10-CM | POA: Diagnosis not present

## 2024-01-31 DIAGNOSIS — I10 Essential (primary) hypertension: Secondary | ICD-10-CM | POA: Diagnosis not present

## 2024-02-23 DIAGNOSIS — E876 Hypokalemia: Secondary | ICD-10-CM | POA: Diagnosis not present

## 2024-02-23 DIAGNOSIS — I10 Essential (primary) hypertension: Secondary | ICD-10-CM | POA: Diagnosis not present

## 2024-05-01 DIAGNOSIS — L821 Other seborrheic keratosis: Secondary | ICD-10-CM | POA: Diagnosis not present

## 2024-05-01 DIAGNOSIS — B078 Other viral warts: Secondary | ICD-10-CM | POA: Diagnosis not present

## 2024-05-01 DIAGNOSIS — D1801 Hemangioma of skin and subcutaneous tissue: Secondary | ICD-10-CM | POA: Diagnosis not present

## 2024-05-01 DIAGNOSIS — R238 Other skin changes: Secondary | ICD-10-CM | POA: Diagnosis not present

## 2024-05-01 DIAGNOSIS — L578 Other skin changes due to chronic exposure to nonionizing radiation: Secondary | ICD-10-CM | POA: Diagnosis not present

## 2024-05-01 DIAGNOSIS — L2089 Other atopic dermatitis: Secondary | ICD-10-CM | POA: Diagnosis not present

## 2024-05-16 DIAGNOSIS — E876 Hypokalemia: Secondary | ICD-10-CM | POA: Diagnosis not present

## 2024-05-16 DIAGNOSIS — I10 Essential (primary) hypertension: Secondary | ICD-10-CM | POA: Diagnosis not present

## 2024-06-27 DIAGNOSIS — S93401A Sprain of unspecified ligament of right ankle, initial encounter: Secondary | ICD-10-CM | POA: Diagnosis not present

## 2024-06-27 DIAGNOSIS — R7309 Other abnormal glucose: Secondary | ICD-10-CM | POA: Diagnosis not present

## 2024-06-27 DIAGNOSIS — S93401D Sprain of unspecified ligament of right ankle, subsequent encounter: Secondary | ICD-10-CM | POA: Diagnosis not present

## 2024-09-30 DIAGNOSIS — Z23 Encounter for immunization: Secondary | ICD-10-CM | POA: Diagnosis not present

## 2024-09-30 DIAGNOSIS — Z6841 Body Mass Index (BMI) 40.0 and over, adult: Secondary | ICD-10-CM | POA: Diagnosis not present

## 2024-09-30 DIAGNOSIS — I10 Essential (primary) hypertension: Secondary | ICD-10-CM | POA: Diagnosis not present

## 2024-09-30 DIAGNOSIS — R0683 Snoring: Secondary | ICD-10-CM | POA: Diagnosis not present

## 2024-09-30 DIAGNOSIS — E66813 Obesity, class 3: Secondary | ICD-10-CM | POA: Diagnosis not present

## 2024-10-08 DIAGNOSIS — I10 Essential (primary) hypertension: Secondary | ICD-10-CM | POA: Diagnosis not present

## 2024-10-08 DIAGNOSIS — G4730 Sleep apnea, unspecified: Secondary | ICD-10-CM | POA: Diagnosis not present

## 2024-10-08 DIAGNOSIS — G2581 Restless legs syndrome: Secondary | ICD-10-CM | POA: Diagnosis not present

## 2024-10-23 DIAGNOSIS — G2581 Restless legs syndrome: Secondary | ICD-10-CM | POA: Diagnosis not present
